# Patient Record
Sex: Male | Born: 1990 | Race: Black or African American | Hispanic: No | Marital: Single | Smoking: Never smoker
Health system: Southern US, Community
[De-identification: ages and names within clinical notes are randomized; demographics above are authoritative.]

---

## 2016-10-29 ENCOUNTER — Emergency Department (HOSPITAL_COMMUNITY): Payer: Self-pay

## 2016-10-29 ENCOUNTER — Emergency Department (HOSPITAL_COMMUNITY)
Admission: EM | Admit: 2016-10-29 | Discharge: 2016-10-30 | Disposition: A | Discharge status: Home / Self Care | Payer: Self-pay | Attending: Emergency Medicine | Admitting: Emergency Medicine

## 2016-10-29 ENCOUNTER — Encounter (HOSPITAL_COMMUNITY): Payer: Self-pay | Admitting: *Deleted

## 2016-10-29 DIAGNOSIS — Y990 Civilian activity done for income or pay: Secondary | ICD-10-CM | POA: Insufficient documentation

## 2016-10-29 DIAGNOSIS — Z79899 Other long term (current) drug therapy: Secondary | ICD-10-CM | POA: Insufficient documentation

## 2016-10-29 DIAGNOSIS — X32XXXA Exposure to sunlight, initial encounter: Secondary | ICD-10-CM | POA: Insufficient documentation

## 2016-10-29 DIAGNOSIS — Y9289 Other specified places as the place of occurrence of the external cause: Secondary | ICD-10-CM | POA: Insufficient documentation

## 2016-10-29 DIAGNOSIS — T675XXA Heat exhaustion, unspecified, initial encounter: Secondary | ICD-10-CM

## 2016-10-29 DIAGNOSIS — T673XXA Heat exhaustion, anhydrotic, initial encounter: Secondary | ICD-10-CM | POA: Insufficient documentation

## 2016-10-29 DIAGNOSIS — Y939 Activity, unspecified: Secondary | ICD-10-CM | POA: Insufficient documentation

## 2016-10-29 LAB — CBC WITH DIFFERENTIAL/PLATELET
Basophils Absolute: 0 10*3/uL (ref 0.0–0.1)
Basophils Relative: 0 %
EOS ABS: 0.4 10*3/uL (ref 0.0–0.7)
Eosinophils Relative: 5 %
HEMATOCRIT: 43 % (ref 39.0–52.0)
HEMOGLOBIN: 14.5 g/dL (ref 13.0–17.0)
LYMPHS ABS: 1.6 10*3/uL (ref 0.7–4.0)
Lymphocytes Relative: 17 %
MCH: 30.2 pg (ref 26.0–34.0)
MCHC: 33.7 g/dL (ref 30.0–36.0)
MCV: 89.6 fL (ref 78.0–100.0)
MONOS PCT: 5 %
Monocytes Absolute: 0.4 10*3/uL (ref 0.1–1.0)
NEUTROS ABS: 7 10*3/uL (ref 1.7–7.7)
NEUTROS PCT: 73 %
Platelets: 300 10*3/uL (ref 150–400)
RBC: 4.8 MIL/uL (ref 4.22–5.81)
RDW: 12.9 % (ref 11.5–15.5)
WBC: 9.5 10*3/uL (ref 4.0–10.5)

## 2016-10-29 LAB — COMPREHENSIVE METABOLIC PANEL
ALT: 14 U/L — AB (ref 17–63)
AST: 22 U/L (ref 15–41)
Albumin: 4.2 g/dL (ref 3.5–5.0)
Alkaline Phosphatase: 53 U/L (ref 38–126)
Anion gap: 8 (ref 5–15)
BUN: 11 mg/dL (ref 6–20)
CHLORIDE: 103 mmol/L (ref 101–111)
CO2: 24 mmol/L (ref 22–32)
CREATININE: 1.09 mg/dL (ref 0.61–1.24)
Calcium: 9 mg/dL (ref 8.9–10.3)
GFR calc non Af Amer: >60 mL/min (ref >60)
Glucose, Bld: 119 mg/dL — ABNORMAL HIGH (ref 65–99)
Potassium: 3.5 mmol/L (ref 3.5–5.1)
SODIUM: 135 mmol/L (ref 135–145)
Total Bilirubin: 0.7 mg/dL (ref 0.3–1.2)
Total Protein: 7.8 g/dL (ref 6.5–8.1)

## 2016-10-29 MED ORDER — SODIUM CHLORIDE 0.9 % IV BOLUS (SEPSIS)
1000.0000 mL | Freq: Once | INTRAVENOUS | Status: AC
  Administered 2016-10-29: 1000 mL via INTRAVENOUS

## 2016-10-29 MED ORDER — ONDANSETRON 4 MG PO TBDP: 4.0000 mg | ORAL_TABLET | Freq: Three times a day (TID) | ORAL | 0 refills | Status: AC | PRN

## 2016-10-29 MED ORDER — ACETAMINOPHEN 500 MG PO TABS
1000.0000 mg | ORAL_TABLET | Freq: Once | ORAL | Status: AC
  Administered 2016-10-29: 1000 mg via ORAL
  Filled 2016-10-29: qty 2

## 2016-10-29 NOTE — ED Triage Notes (Signed)
Pt brought in by [facility] for c/o loc; ems reports pt was found in car with 80/50; pt c/o headache; pt given zofran 4mg  IV; cbg 102

## 2016-10-29 NOTE — Discharge Instructions (Signed)
[facility] Doctor List ° ° ° °Edward Hawkins MD. Specialty: Pulmonary Disease Contact information: [address]  °[address]  °[place] [address]  °[phone]  ° °Margaret Simpson, MD. Specialty: Family Medicine Contact information: [address]  °[place] [address]  °[phone]  ° °Scott Luking, MD. Specialty: Family Medicine Contact information: [address]  °[address]  °[place] [address]  °[phone]  ° °Tesfaye Fanta, MD Specialty: Internal Medicine Contact information: [address]  °[place] [address]  °[phone]  ° °Zach Hall, MD. Specialty: Internal Medicine Contact information: [address]  °[place] [address]  °[phone]  ° °Angus Mcinnis, MD. Specialty: Family Medicine Contact information: [address]  °[place] [address]  °[phone]  ° °Stephen Knowlton, MD. Specialty: Family Medicine Contact information: [address]  °[address]  °[place] [address]  °[phone]  ° °Roy Fagan, MD. Specialty: Internal Medicine Contact information: [address]  °[address]  °[place] [address]  °[phone]  ° °Please obtain all of your results from medical records or have your doctors office obtain the results - share them with your doctor - you should be seen at your doctors office in the next 2 days. Call today to arrange your follow up. Take the medications as prescribed. Please review all of the medicines and only take them if you do not have an allergy to them. Please be aware that if you are taking birth control pills, taking other prescriptions, ESPECIALLY ANTIBIOTICS may make the birth control ineffective - if this is the case, either do not engage in sexual activity or use alternative methods of birth control such as condoms until you have finished the medicine and your family doctor says it is OK to restart them. If you are on a blood thinner such as COUMADIN, be aware that any other medicine that you  take may cause the coumadin to either work too much, or not enough - you should have your coumadin level rechecked in next 7 days if this is the case.  °?  °It is also a possibility that you have an allergic reaction to any of the medicines that you have been prescribed - Everybody reacts differently to medications and while MOST people have no trouble with most medicines, you may have a reaction such as nausea, vomiting, rash, swelling, shortness of breath. If this is the case, please stop taking the medicine immediately and contact your physician.  °?  °You should return to the ER if you develop severe or worsening symptoms.  ° ° °

## 2016-10-29 NOTE — ED Provider Notes (Signed)
[facility] Provider Note   CSN: [id number] Arrival date & time: 10/29/16  2121     History   Chief Complaint Chief Complaint  Patient presents with  . Loss of Consciousness    HPI Jackson Carter is a 26 y.o. male.  HPI  The patient is a 26 year old male, no significant past medical history, currently taking doxycycline for what he reports as a sexually transmitted disease. 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 He does not recall anything after taking down one of the water slides by hand. He was found in his car passed out, he was brought to the hospital. The patient does not remember anything about passing out or being in his car, he does have a headache. He feels generally weak. He does endorse having some food today including a sandwich, lemonade and a fried Oreo.  No hx of other PMH, no hx of aneurysm or SAH and no trauma.  Was feeling fine at work earlier in the day.  History reviewed. No pertinent past medical history.  There are no active problems to display for this patient.   History reviewed. No pertinent surgical history.     Home Medications    Prior to Admission medications   Medication Sig Start Date End Date Taking? Authorizing Provider  acetaminophen (TYLENOL) 325 MG tablet Take 325 mg by mouth every 6 (six) hours as needed.   Yes [provider]  doxycycline (VIBRA-TABS) 100 MG tablet Take 100 mg by mouth 2 (two) times daily.   Yes [provider]  ondansetron (ZOFRAN ODT) 4 MG disintegrating tablet Take 1 tablet (4 mg total) by mouth every 8 (eight) hours as needed for nausea. 10/29/16   Eber Hong, MD    Family History History reviewed. No pertinent family history.  Social History Social History  Substance Use Topics  . Smoking status: Never Smoker  . Smokeless tobacco: Never Used  . Alcohol use No     Allergies     Patient has no known allergies.   Review of Systems Review of Systems  All other systems reviewed and are negative.    Physical Exam Updated Vital Signs BP 115/62   Pulse 66   Temp 98.3 F (36.8 C) (Rectal)   Resp 14   Ht 6\' 1"  (1.854 m)   Wt 77.1 kg (170 lb)   SpO2 99%   BMI 22.43 kg/m   Physical Exam  Constitutional: He appears well-developed and well-nourished. No distress.  HENT:  Head: Normocephalic and atraumatic.  Mouth/Throat: Oropharynx is clear and moist. No oropharyngeal exudate.  Nasal passages or congested  Eyes: Conjunctivae and EOM are normal. Pupils are equal, round, and reactive to light. Right eye exhibits no discharge. Left eye exhibits no discharge. No scleral icterus.  Neck: Normal range of motion. Neck supple. No JVD present. No thyromegaly present.  Cardiovascular: Normal rate, regular rhythm, normal heart sounds and intact distal pulses.  Exam reveals no gallop and no friction rub.   No murmur heard. Pulmonary/Chest: Effort normal and breath sounds normal. No respiratory distress. He has no wheezes. He has no rales.  Abdominal: Soft. Bowel sounds are normal. He exhibits no distension and no mass. There is no tenderness.  Musculoskeletal: Normal range of motion. He exhibits no edema or tenderness.  Lymphadenopathy:    He has no cervical adenopathy.  Neurological: He  is alert. Coordination normal.  Neurologic exam:  Speech clear, pupils equal round reactive to light, extraocular movements intact  Normal peripheral visual fields Cranial nerves III through XII normal including no facial droop Follows commands, moves all extremities x4, normal strength to bilateral upper and lower extremities at all major muscle groups including grip Sensation normal to light touch and pinprick Coordination intact, no limb ataxia, finger-nose-finger normal Rapid alternating movements normal No pronator drift Gait normal   Skin: Skin is warm and dry. No rash  noted. No erythema.  Psychiatric: He has a normal mood and affect. His behavior is normal.  Nursing note and vitals reviewed.    ED Treatments / Results  Labs (all labs ordered are listed, but only abnormal results are displayed) Labs Reviewed  COMPREHENSIVE METABOLIC PANEL - Abnormal; Notable for the following:       Result Value   Glucose, Bld 119 (*)    ALT 14 (*)    All other components within normal limits  CBC WITH DIFFERENTIAL/PLATELET    EKG  EKG Interpretation  Date/Time:  Tuesday October 29 2016 21:24:33 EDT Ventricular Rate:  88 PR Interval:    QRS Duration: 95 QT Interval:  359 QTC Calculation: 435 R Axis:   91 Text Interpretation:  Sinus rhythm Borderline right axis deviation RSR' in V1 or V2, probably normal variant ST elev, probable normal early repol pattern No old tracing to compare Confirmed by Eber HongMiller, Jaina Morin ([id number]) on 10/29/2016 11:12:41 PM       Radiology Ct Head Wo Contrast  Result Date: 10/29/2016 CLINICAL DATA:  Headache.  Loss of consciousness. EXAM: CT HEAD WITHOUT CONTRAST TECHNIQUE: Contiguous axial images were obtained from the base of the skull through the vertex without intravenous contrast. COMPARISON:  None. FINDINGS: Brain: No evidence of acute infarction, hemorrhage, hydrocephalus, extra-axial collection or mass lesion/mass effect. Vascular: No hyperdense vessel or unexpected calcification. Skull: Normal. Negative for fracture or focal lesion. Sinuses/Orbits: Mucosal thickening of ethmoid air cells. Visualized orbits are unremarkable. Mastoid air cells are clear. Other: None. IMPRESSION: No acute intracranial abnormality. Electronically Signed   By: Rubye OaksMelanie  Ehinger M.D.   On: 10/29/2016 22:57    Procedures Procedures (including critical care time)  Medications Ordered in ED Medications  sodium chloride 0.9 % bolus 1,000 mL (1,000 mLs Intravenous New Bag/Given 10/29/16 2232)  acetaminophen (TYLENOL) tablet 1,000 mg (1,000 mg Oral Given 10/29/16  2231)     Initial Impression / Assessment and Plan / ED Course  I have reviewed the triage vital signs and the nursing notes.  Pertinent labs & imaging results that were available during my care of the patient were reviewed by me and considered in my medical decision making (see chart for details).     The patient is well-appearing clinically but does have ongoing headache after loss of consciousness. It is unclear if this is just teething exhaustion, dehydration a combination of those things or possibly something more concerning such as a subarachnoid hemorrhage. We'll further evaluate with labs and a CT scan. IV hydration has begun.  Labs are normal, the patient feels better, he has been given IV fluids, stable for discharge  Final Clinical Impressions(s) / ED Diagnoses   Final diagnoses:  Heat exhaustion, initial encounter    New Prescriptions New Prescriptions   ONDANSETRON (ZOFRAN ODT) 4 MG DISINTEGRATING TABLET    Take 1 tablet (4 mg total) by mouth every 8 (eight) hours as needed for nausea.     Eber HongMiller, Canden Cieslinski, MD 10/29/16  2329  

## 2018-07-13 IMAGING — CT CT HEAD W/O CM
4 series · 17 of 47 positions shown, 19 images · non-contrast
Comparison: None.

CLINICAL DATA: Headache.  Loss of consciousness.

EXAM:
CT HEAD WITHOUT CONTRAST
TECHNIQUE: Contiguous axial images were obtained from the base of the skull
through the vertex without intravenous contrast.

[Series 2: head trauma wo · axial · 0.44mm/px · z∈[+1804,+1914]mm · 7 of 30 slices shown, 9 images]
[im 4/30  brain]
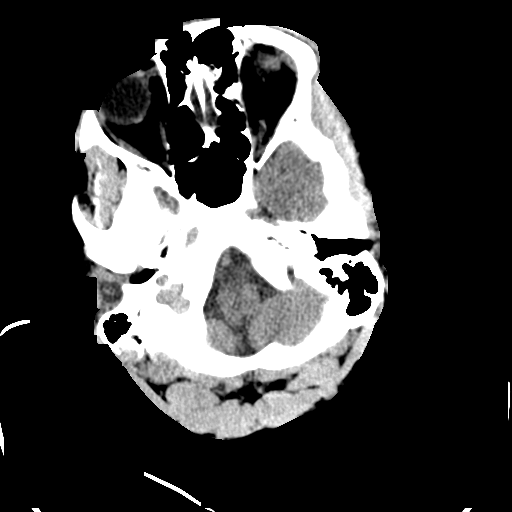
[im 4/30  bone]
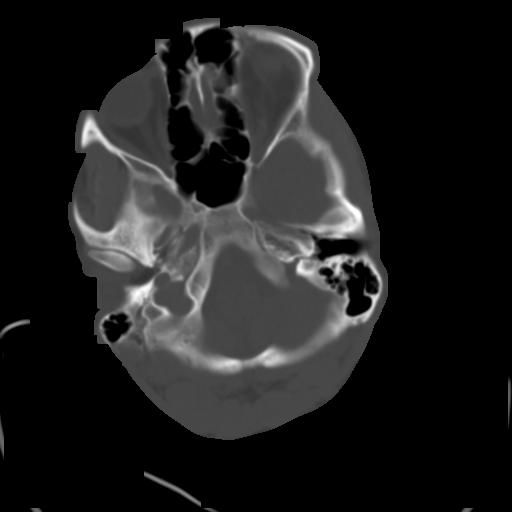
[im 8/30  brain]
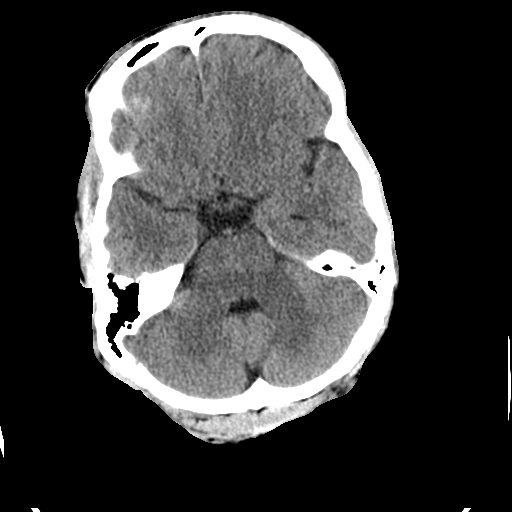
[im 11/30  brain]
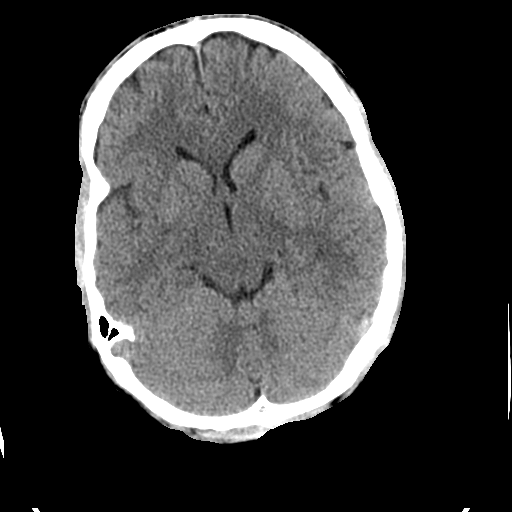
[im 15/30  brain]
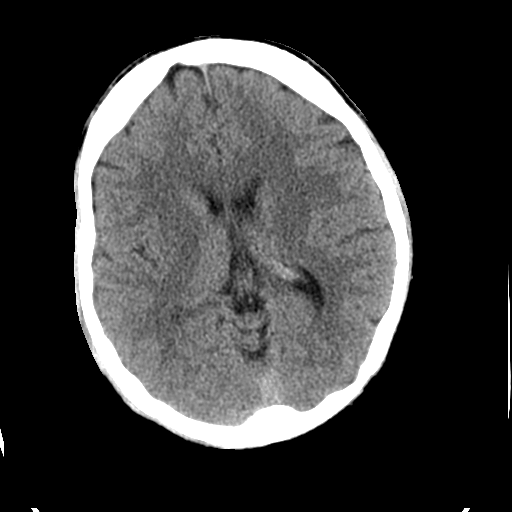
[im 19/30  brain]
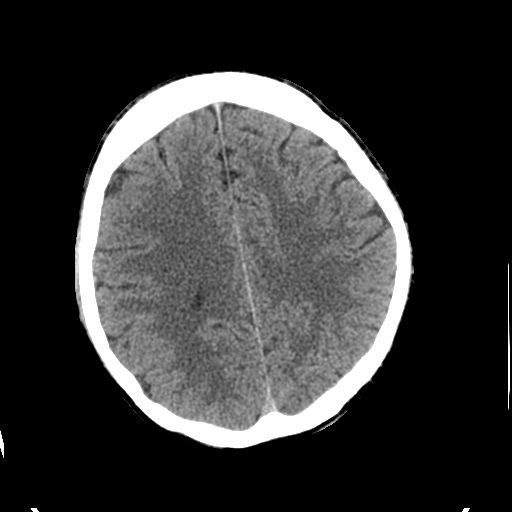
[im 19/30  bone]
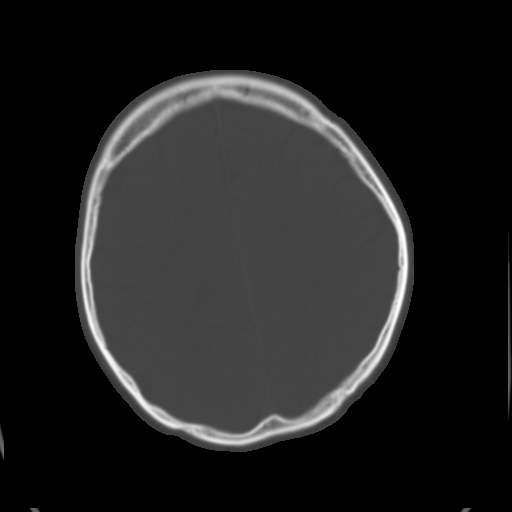
[im 22/30  brain]
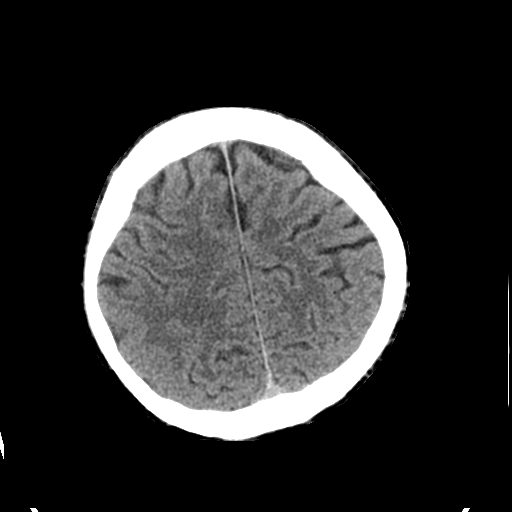
[im 26/30  brain]
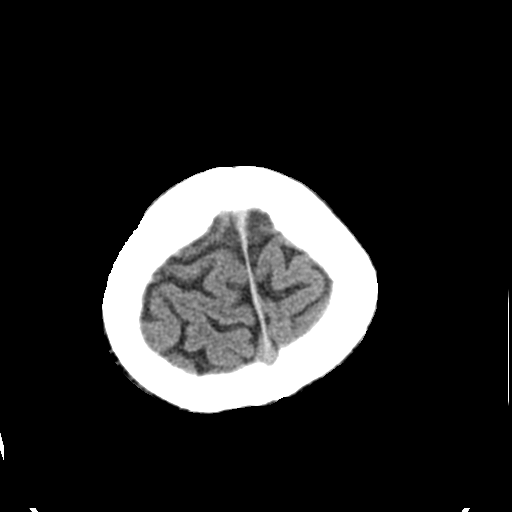

[Series 3: head bone · axial · 0.44mm/px · z∈[+1803,+1853]mm · 4 of 74 slices shown]
[im 8/74  bone]
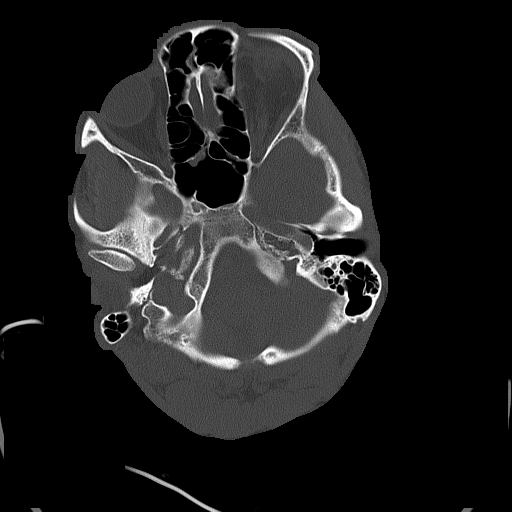
[im 15/74  bone]
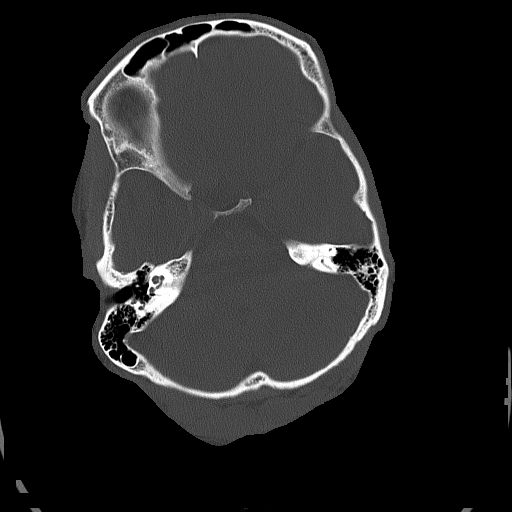
[im 22/74  bone]
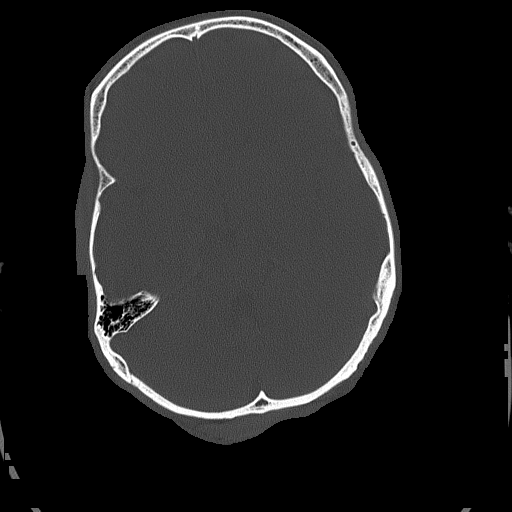
[im 33/74  bone]
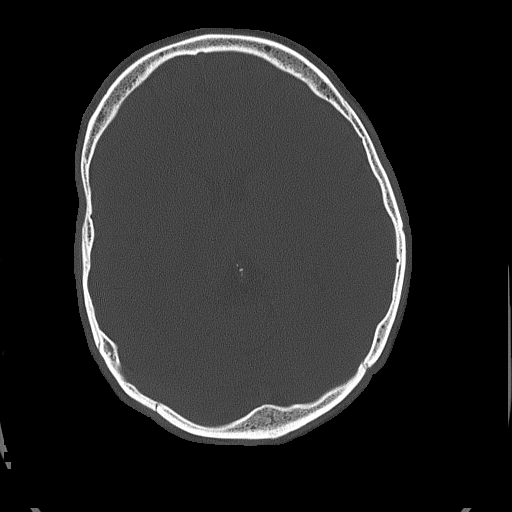

[Series 4: coronal soft tissue · coronal · 0.36mm/px · 3 of 67 slices shown]
[im 23/67  brain]
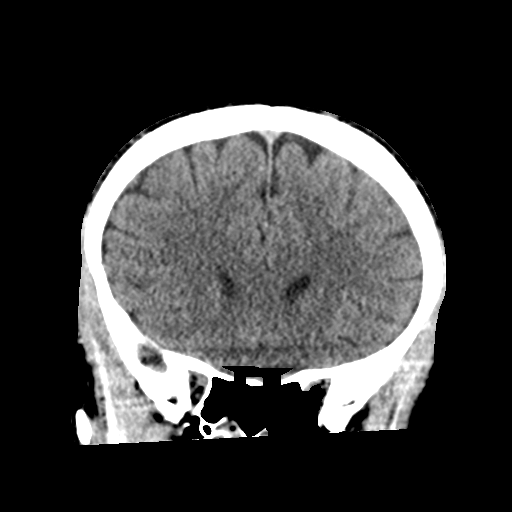
[im 30/67  brain]
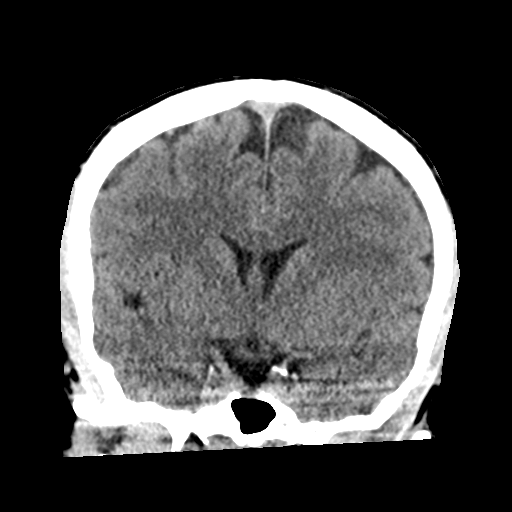
[im 37/67  brain]
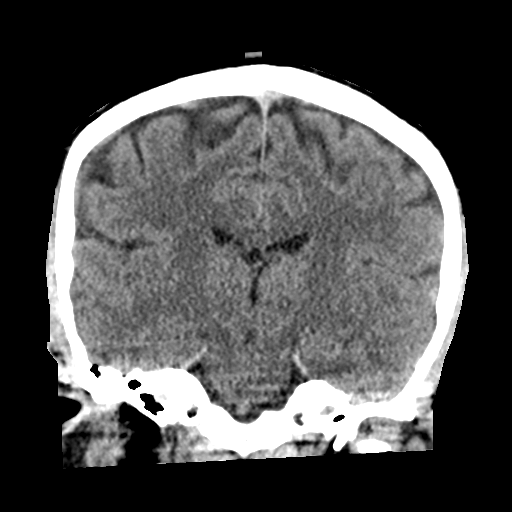

[Series 5: sagittal soft tissue · sagittal · 0.34mm/px · 3 of 55 slices shown]
[im 19/55  brain]
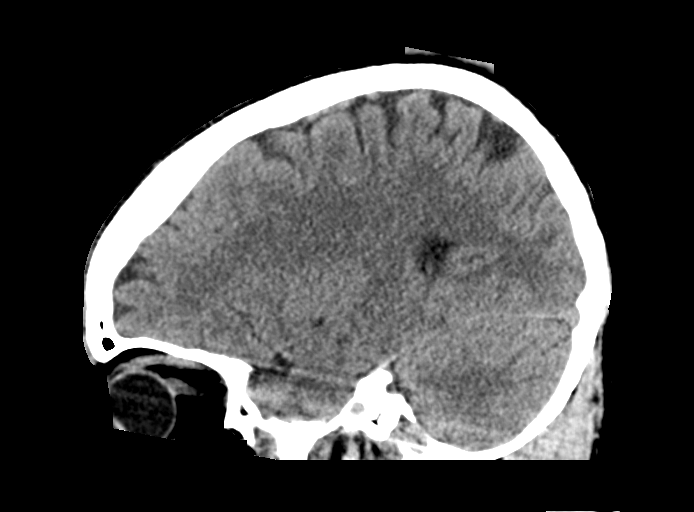
[im 28/55  brain]
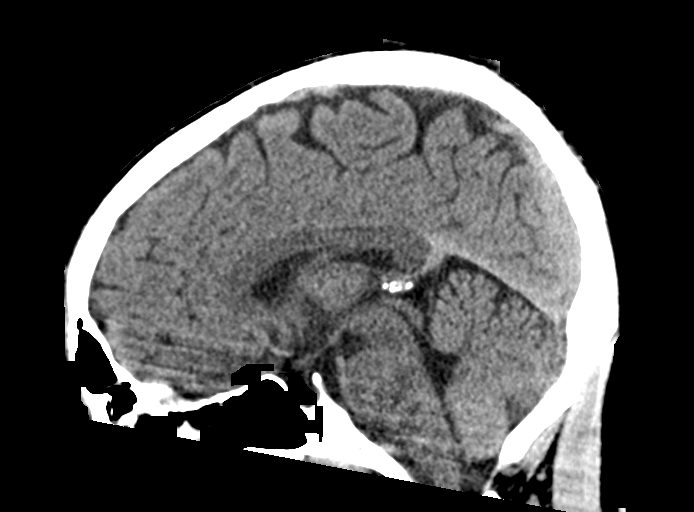
[im 37/55  brain]
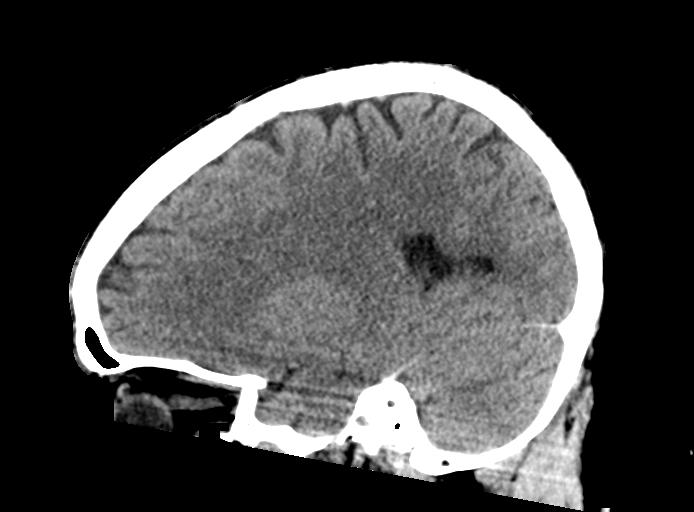

[17 of 47 positions shown; findings below may reference images not displayed]

FINDINGS: Brain: No evidence of acute infarction, hemorrhage, hydrocephalus,
extra-axial collection or mass lesion/mass effect.

Vascular: No hyperdense vessel or unexpected calcification.

Skull: Normal. Negative for fracture or focal lesion.

Sinuses/Orbits: Mucosal thickening of ethmoid air cells. Visualized
orbits are unremarkable. Mastoid air cells are clear.

Other: None.
IMPRESSION: No acute intracranial abnormality.
